# Patient Record
Sex: Female | Born: 1995 | Race: Black or African American | Hispanic: No | Marital: Single | Smoking: Never smoker
Health system: Southern US, Community
[De-identification: ages and names within clinical notes are randomized; demographics above are authoritative.]

---

## 2021-03-05 ENCOUNTER — Inpatient Hospital Stay (HOSPITAL_BASED_OUTPATIENT_CLINIC_OR_DEPARTMENT_OTHER): Payer: Medicaid Other

## 2021-03-05 ENCOUNTER — Inpatient Hospital Stay (HOSPITAL_COMMUNITY)
Admission: AD | Admit: 2021-03-05 | Discharge: 2021-03-05 | Disposition: A | Discharge status: Home / Self Care | Payer: Medicaid Other | Attending: Obstetrics & Gynecology | Admitting: Obstetrics & Gynecology

## 2021-03-05 ENCOUNTER — Encounter (HOSPITAL_COMMUNITY): Payer: Self-pay | Admitting: Obstetrics & Gynecology

## 2021-03-05 DIAGNOSIS — O4442 Low lying placenta NOS or without hemorrhage, second trimester: Secondary | ICD-10-CM | POA: Diagnosis not present

## 2021-03-05 DIAGNOSIS — O4440 Low lying placenta NOS or without hemorrhage, unspecified trimester: Secondary | ICD-10-CM

## 2021-03-05 DIAGNOSIS — O4412 Placenta previa with hemorrhage, second trimester: Secondary | ICD-10-CM | POA: Diagnosis present

## 2021-03-05 DIAGNOSIS — Z3A25 25 weeks gestation of pregnancy: Secondary | ICD-10-CM | POA: Insufficient documentation

## 2021-03-05 NOTE — MAU Provider Note (Signed)
History     [id number]  Arrival date and time: 03/05/21 1536    Chief Complaint  Patient presents with  . Pregnancy Ultrasound     HPI Christine Bruce is a 25 y.o. at [redacted]w[redacted]d by patient report with PMHx notable for placenta previa, who presents for concern regarding placenta previa.   Patient just moved to [place] from [place], [place] to be closer to her mother who also moved here from [place] She was planning to go to [facility] but they informed her they don't take medicaid Currently planning to go to [facility] Reports she has a placenta previa and was told to get a follow up US soon but hasn't been able to since she moved Denies bleeding, abdominal pain, vaginal loss of fluid Endorses good fetal movement     OB History    Gravida  1   Para      Term      Preterm      AB      Living        SAB      IAB      Ectopic      Multiple      Live Births              History reviewed. No pertinent past medical history.  History reviewed. No pertinent surgical history.  No family history on file.  Social History   Socioeconomic History  . Marital status: Single    Spouse name: Not on file  . Number of children: Not on file  . Years of education: Not on file  . Highest education level: Not on file  Occupational History  . Not on file  Tobacco Use  . Smoking status: Never Smoker  . Smokeless tobacco: Never Used  Vaping Use  . Vaping Use: Never used  Substance and Sexual Activity  . Alcohol use: Not Currently  . Drug use: Never  . Sexual activity: Not Currently  Other Topics Concern  . Not on file  Social History Narrative  . Not on file   Social Determinants of Health   Financial Resource Strain: Not on file  Food Insecurity: Not on file  Transportation Needs: Not on file  Physical Activity: Not on file  Stress: Not on file  Social Connections: Not on file  Intimate Partner Violence: Not on file    No Known Allergies  No current  facility-administered medications on file prior to encounter.   Current Outpatient Medications on File Prior to Encounter  Medication Sig Dispense Refill  . calcium-vitamin D (OSCAL WITH D) 500-200 MG-UNIT tablet Take 1 tablet by mouth.    . Prenatal Vit-Fe Fumarate-FA (PRENATAL MULTIVITAMIN) TABS tablet Take 1 tablet by mouth daily at 12 noon.       ROS Pertinent positives and negative per HPI, all others reviewed and negative  Physical Exam   BP 107/77 (BP Location: Right Arm)   Pulse (!) 101   Temp 99 F (37.2 C) (Oral)   Resp 16   Ht 4\' 8"  (1.422 m)   Wt 46.4 kg   SpO2 100%   BMI 22.94 kg/m   Patient Vitals for the past 24 hrs:  BP Temp Temp src Pulse Resp SpO2 Height Weight  03/05/21 1839 -- -- -- (!) 101 16 100 % -- --  03/05/21 1553 107/77 99 F (37.2 C) Oral 88 16 100 % 4\' 8"  (1.422 m) 46.4 kg    Physical Exam Vitals reviewed.  Constitutional:  General: She is not in acute distress.    Appearance: She is well-developed. She is not diaphoretic.  Eyes:     General: No scleral icterus. Pulmonary:     Effort: Pulmonary effort is normal. No respiratory distress.  Abdominal:     General: There is no distension.     Palpations: Abdomen is soft.     Tenderness: There is no abdominal tenderness. There is no guarding or rebound.  Skin:    General: Skin is warm and dry.  Neurological:     Mental Status: She is alert.     Coordination: Coordination normal.      Cervical Exam    Bedside Ultrasound Pt informed that the ultrasound is considered a limited OB ultrasound and is not intended to be a complete ultrasound exam.  Patient also informed that the ultrasound is not being completed with the intent of assessing for fetal or placental anomalies or any pelvic abnormalities.  Explained that the purpose of today's ultrasound is to assess for  placental location.  Patient acknowledges the purpose of the exam and the limitations of the study.    My interpretation:  placenta appears low lying but not certain with bedside US  FHT Baseline 150, moderate variability, +10x10 accels, no decels Toco: quiet Cat: I  Labs No results found for this or any previous visit (from the past 24 hour(s)).  Imaging No results found.  MAU Course  Procedures Lab Orders  No laboratory test(s) ordered today   No orders of the defined types were placed in this encounter.   Imaging Orders     [place] MFM OB Limited  MDM moderate  Assessment and Plan  #Low lying placenta #Transfer of prenatal care Bedside US suggestive of low lying placenta, but given newly transferred to area and no prior records available obtained limited US. This confirms low lying posterior placenta 1.8 cm from os. Discussed with patient she no longer carries diagnosis of placenta previa and reassured her that it will likely continue to migrate further as she advances in her pregnancy. Patient planning to establish care with [facility], ROI completed and will ask registration to fax it to her provider in [place]. Encouraged her to call [facility] practice of her choice to establish care ASAP.   #FWB FHT Cat I NST: Reactive  Discharged to home in stable condition.   Venora Maples, MD/MPH 03/05/21 6:51 PM  Allergies as of 03/05/2021   No Known Allergies     Medication List    TAKE these medications   calcium-vitamin D 500-200 MG-UNIT tablet Commonly known as: OSCAL WITH D Take 1 tablet by mouth.   prenatal multivitamin Tabs tablet Take 1 tablet by mouth daily at 12 noon.

## 2021-03-05 NOTE — Discharge Instructions (Signed)
[facility] Providers    [facility]      Phone: [phone]  [facility]    Phone: [phone]  [facility] [place]   Phone: [phone]  [facility] [place]   Phone: [phone]  [facility] [place]   Phone: [phone]  [facility]    Phone: [phone]  [facility]        Phone: [phone]  [facility]     Phone: [phone]   [facility]     Phone: [phone]  [facility]     Phone: [phone]  [facility]     Phone: [phone]  [facility]        Phone: [phone]   [facility]   Phone: [phone]  [facility]     Phone: [phone]  [facility]    Phone: [phone]  [facility]       Phone: [phone]  [facility]     Phone: [phone]   

## 2021-03-05 NOTE — MAU Note (Signed)
Pt just moved from [facility]. She is [redacted]w[redacted]d. She is G1P0. She states that she  "placenta covering her cervix." She missed the follow-up U/S in [place] to check on this. She denies VB, or abdominal cramping. +FM.

## 2021-03-06 DIAGNOSIS — Z3A25 25 weeks gestation of pregnancy: Secondary | ICD-10-CM

## 2021-03-06 DIAGNOSIS — O4402 Placenta previa specified as without hemorrhage, second trimester: Secondary | ICD-10-CM

## 2021-03-13 ENCOUNTER — Inpatient Hospital Stay (HOSPITAL_COMMUNITY)
Admission: EM | Admit: 2021-03-13 | Discharge: 2021-03-14 | Disposition: A | Discharge status: Home / Self Care | Payer: Medicaid Other | Attending: Obstetrics and Gynecology | Admitting: Obstetrics and Gynecology

## 2021-03-13 ENCOUNTER — Encounter (HOSPITAL_COMMUNITY): Payer: Self-pay | Admitting: Obstetrics and Gynecology

## 2021-03-13 DIAGNOSIS — O4692 Antepartum hemorrhage, unspecified, second trimester: Secondary | ICD-10-CM | POA: Insufficient documentation

## 2021-03-13 DIAGNOSIS — O4690 Antepartum hemorrhage, unspecified, unspecified trimester: Secondary | ICD-10-CM

## 2021-03-13 DIAGNOSIS — B373 Candidiasis of vulva and vagina: Secondary | ICD-10-CM | POA: Insufficient documentation

## 2021-03-13 DIAGNOSIS — O2392 Unspecified genitourinary tract infection in pregnancy, second trimester: Secondary | ICD-10-CM | POA: Insufficient documentation

## 2021-03-13 DIAGNOSIS — O98819 Other maternal infectious and parasitic diseases complicating pregnancy, unspecified trimester: Secondary | ICD-10-CM

## 2021-03-13 DIAGNOSIS — Z3689 Encounter for other specified antenatal screening: Secondary | ICD-10-CM

## 2021-03-13 DIAGNOSIS — Z3A26 26 weeks gestation of pregnancy: Secondary | ICD-10-CM | POA: Insufficient documentation

## 2021-03-13 NOTE — ED Provider Notes (Signed)
MSE was initiated and I personally evaluated the patient and placed orders (if any) at  11:24 PM on March 13, 2021.  Patient to ED with vaginal bleeding. Chart reviewed. Admission 6/2 with placenta previa, then 25w.   Discussed the patient with MAU provider who accepts the patient for transfer.   The patient appears stable so that the remainder of the MSE may be completed by another provider.   Elpidio Anis, PA-C 03/13/21 2325    Gwyneth Sprout, MD 03/15/21 1559

## 2021-03-13 NOTE — ED Notes (Signed)
Pt checked in to the ed [redacted]weeks pregnant   She checked in and the triage area was full  No vitals  Provider was called in mau  Pt  Sent there from the waiting room

## 2021-03-13 NOTE — MAU Note (Signed)
Pt transferred from [facility]. C/O vaginal bleeding. Pt stated she went to the BR tonight and wiped and saw some blood. Denies any pain or cramping. Pt was to;s earlier in the pregnancy she had a previa but had a follow up scan last week and was told it was not anymore.  Good fetal movement felt.

## 2021-03-14 ENCOUNTER — Inpatient Hospital Stay (HOSPITAL_BASED_OUTPATIENT_CLINIC_OR_DEPARTMENT_OTHER): Payer: Medicaid Other

## 2021-03-14 DIAGNOSIS — Z3A26 26 weeks gestation of pregnancy: Secondary | ICD-10-CM

## 2021-03-14 DIAGNOSIS — O4692 Antepartum hemorrhage, unspecified, second trimester: Secondary | ICD-10-CM | POA: Diagnosis not present

## 2021-03-14 DIAGNOSIS — O468X2 Other antepartum hemorrhage, second trimester: Secondary | ICD-10-CM

## 2021-03-14 DIAGNOSIS — B373 Candidiasis of vulva and vagina: Secondary | ICD-10-CM

## 2021-03-14 DIAGNOSIS — O23592 Infection of other part of genital tract in pregnancy, second trimester: Secondary | ICD-10-CM

## 2021-03-14 DIAGNOSIS — O2392 Unspecified genitourinary tract infection in pregnancy, second trimester: Secondary | ICD-10-CM | POA: Diagnosis not present

## 2021-03-14 LAB — CBC
HCT: 28.5 % — ABNORMAL LOW (ref 36.0–46.0)
Hemoglobin: 9.4 g/dL — ABNORMAL LOW (ref 12.0–15.0)
MCH: 27.9 pg (ref 26.0–34.0)
MCHC: 33 g/dL (ref 30.0–36.0)
MCV: 84.6 fL (ref 80.0–100.0)
Platelets: 261 10*3/uL (ref 150–400)
RBC: 3.37 MIL/uL — ABNORMAL LOW (ref 3.87–5.11)
RDW: 12.8 % (ref 11.5–15.5)
WBC: 7.7 10*3/uL (ref 4.0–10.5)
nRBC: 0 % (ref 0.0–0.2)

## 2021-03-14 LAB — WET PREP, GENITAL
Clue Cells Wet Prep HPF POC: NONE SEEN
Sperm: NONE SEEN
Trich, Wet Prep: NONE SEEN

## 2021-03-14 MED ORDER — TERCONAZOLE 0.4 % VA CREA: 1.0000 | TOPICAL_CREAM | Freq: Every day | VAGINAL | 0 refills | Status: AC

## 2021-03-14 NOTE — MAU Provider Note (Signed)
History     CSN: [id number]  Arrival date and time: 03/13/21 2247   Event Date/Time   First Provider Initiated Contact with Patient 03/14/21 0013      Chief Complaint  Patient presents with   Vaginal Bleeding   Christine Bruce is a 25 y.o. G1P0 at [redacted]w[redacted]d who recently relocated from [place], [place].  She presents today for Vaginal Bleeding.  She states she noted bleeding with wiping that was "pinkish red."  She states it was just on the tissue and continued with wiping then "eventually went away."  She states she noted a little blood on arrival.  She denies abdominal cramping, but reports she had some earlier that lasted about 15 seconds.  She endorses fetal movement and denies discharge prior to the bleeding.    OB History     Gravida  1   Para      Term      Preterm      AB      Living         SAB      IAB      Ectopic      Multiple      Live Births              History reviewed. No pertinent past medical history.  History reviewed. No pertinent surgical history.  No family history on file.  Social History   Tobacco Use   Smoking status: Never   Smokeless tobacco: Never  Vaping Use   Vaping Use: Never used  Substance Use Topics   Alcohol use: Not Currently   Drug use: Never    Allergies: No Known Allergies  Medications Prior to Admission  Medication Sig Dispense Refill Last Dose   calcium-vitamin D (OSCAL WITH D) 500-200 MG-UNIT tablet Take 1 tablet by mouth.   03/13/2021   Prenatal Vit-Fe Fumarate-FA (PRENATAL MULTIVITAMIN) TABS tablet Take 1 tablet by mouth daily at 12 noon.   03/13/2021    Review of Systems  Constitutional:  Negative for chills and fever.  Gastrointestinal:  Negative for abdominal pain, constipation, diarrhea, nausea and vomiting.  Genitourinary:  Positive for vaginal bleeding. Negative for difficulty urinating, dysuria and vaginal discharge.  Neurological:  Negative for dizziness, light-headedness and headaches.  Physical  Exam   Blood pressure 114/79, pulse 96, temperature 99 F (37.2 C), resp. rate 18.  Physical Exam Vitals reviewed. Exam conducted with a chaperone present.  Constitutional:      Appearance: Normal appearance.  HENT:     Head: Normocephalic and atraumatic.  Eyes:     Conjunctiva/sclera: Conjunctivae normal.  Cardiovascular:     Rate and Rhythm: Normal rate and regular rhythm.     Heart sounds: Normal heart sounds.  Pulmonary:     Effort: Pulmonary effort is normal. No respiratory distress.     Breath sounds: Normal breath sounds.  Genitourinary:    Comments: Sterile Speculum Exam: -Normal External Genitalia: Non tender, no apparent discharge at introitus. Labia appears swollen. No erythema or excoriations noted.  -Vaginal Vault: Pink mucosa with good rugae. Copious amt thick greenish white discharge -wet prep collected -Cervix:Pink, no lesions, cysts, or polyps.  Appears closed. No active bleeding, but moderate amt thick curdy discharge noted from os-GC/CT collected -Bimanual Exam:  Deferred   Musculoskeletal:        General: Normal range of motion.     Cervical back: Normal range of motion.     Right lower leg: No edema.  Left lower leg: No edema.  Skin:    General: Skin is warm and dry.  Neurological:     Mental Status: She is alert and oriented to person, place, and time.  Psychiatric:        Mood and Affect: Mood normal.        Behavior: Behavior normal.        Thought Content: Thought content normal.    Fetal Assessment 145 bpm, Mod Var, -Decels, +Accels Toco: No ctx graphed  MAU Course   Results for orders placed or performed during the hospital encounter of 03/13/21 (from the past 24 hour(s))  CBC     Status: Abnormal   Collection Time: 03/14/21 12:24 AM  Result Value Ref Range   WBC 7.7 4.0 - 10.5 K/uL   RBC 3.37 (L) 3.87 - 5.11 MIL/uL   Hemoglobin 9.4 (L) 12.0 - 15.0 g/dL   HCT 25.6 (L) 38.9 - 37.3 %   MCV 84.6 80.0 - 100.0 fL   MCH 27.9 26.0 - 34.0  pg   MCHC 33.0 30.0 - 36.0 g/dL   RDW 42.8 76.8 - 11.5 %   Platelets 261 150 - 400 K/uL   nRBC 0.0 0.0 - 0.2 %  ABO/Rh     Status: None   Collection Time: 03/14/21 12:24 AM  Result Value Ref Range   ABO/RH(D)      B POS Performed at [facility], [address], [place], [place] [address]   Wet prep, genital     Status: Abnormal   Collection Time: 03/14/21  1:13 AM   Specimen: Vaginal  Result Value Ref Range   Yeast Wet Prep HPF POC PRESENT (A) NONE SEEN   Trich, Wet Prep NONE SEEN NONE SEEN   Clue Cells Wet Prep HPF POC NONE SEEN NONE SEEN   WBC, Wet Prep HPF POC MANY (A) NONE SEEN   Sperm NONE SEEN        MDM PE with Wet Prep, GC/CT Labs: CBC, ABO EFM MFM [place] Assessment and Plan  26 year old G1P0  SIUP at 26.6 weeks Cat I FT Candidiasis of Vagina Vaginal Bleeding  -POC Reviewed -Labs ordered and collected -Exam performed and findings discussed. -Informed that exam is positive for yeast infection. -Discussed treatment. -Reassured that no active bleeding noted from cervix or blood in vault.  -Cultures collected and pending.  -Will send for [place] and await results.  Cherre Robins MSN, CNM 03/14/2021, 12:13 AM   Reassessment (1:27 AM)  -Preliminary [place] returns with small [facility] noted. -Patient informed of findings. No questions or concerns. -Instructed to maintain pelvic rest until ob appt.  -Message sent to [facility] for scheduling of NOB appt. -Bleeding precautions given. -Rx for Terazol 7 sent to pharmacy on file.  -Encouraged to call or return to MAU if symptoms worsen or with the onset of new symptoms. -Discharged to home in stable condition.  Cherre Robins MSN, CNM Advanced Practice Provider, [facility] [employer]

## 2021-03-16 LAB — GC/CHLAMYDIA PROBE AMP (~~LOC~~) NOT AT ARMC
Chlamydia: NEGATIVE
Comment: NEGATIVE
Comment: NORMAL
Neisseria Gonorrhea: NEGATIVE

## 2021-03-16 LAB — ABO/RH: ABO/RH(D): B POS

## 2021-04-13 ENCOUNTER — Ambulatory Visit (INDEPENDENT_AMBULATORY_CARE_PROVIDER_SITE_OTHER): Payer: Medicaid Other | Admitting: Obstetrics and Gynecology

## 2021-04-13 ENCOUNTER — Encounter: Payer: Self-pay | Admitting: Obstetrics and Gynecology

## 2021-04-13 ENCOUNTER — Other Ambulatory Visit: Payer: Self-pay

## 2021-04-13 VITALS — BP 114/79 | HR 86 | Wt 105.5 lb

## 2021-04-13 DIAGNOSIS — O99013 Anemia complicating pregnancy, third trimester: Secondary | ICD-10-CM

## 2021-04-13 DIAGNOSIS — B373 Candidiasis of vulva and vagina: Secondary | ICD-10-CM

## 2021-04-13 DIAGNOSIS — O4443 Low lying placenta NOS or without hemorrhage, third trimester: Secondary | ICD-10-CM | POA: Diagnosis not present

## 2021-04-13 DIAGNOSIS — O0990 Supervision of high risk pregnancy, unspecified, unspecified trimester: Secondary | ICD-10-CM

## 2021-04-13 DIAGNOSIS — O4440 Low lying placenta NOS or without hemorrhage, unspecified trimester: Secondary | ICD-10-CM | POA: Diagnosis not present

## 2021-04-13 DIAGNOSIS — O26842 Uterine size-date discrepancy, second trimester: Secondary | ICD-10-CM

## 2021-04-13 DIAGNOSIS — O0933 Supervision of pregnancy with insufficient antenatal care, third trimester: Secondary | ICD-10-CM

## 2021-04-13 DIAGNOSIS — B3731 Acute candidiasis of vulva and vagina: Secondary | ICD-10-CM

## 2021-04-13 MED ORDER — BLOOD PRESSURE KIT DEVI: 1.0000 | 0 refills | Status: AC | PRN

## 2021-04-13 MED ORDER — BLOOD PRESSURE KIT DEVI: 1.0000 | 0 refills | Status: DC | PRN

## 2021-04-13 NOTE — Patient Instructions (Signed)
Do not have sexual intercourse or place anything in the vagina until your ultrasound is normal.

## 2021-04-13 NOTE — Progress Notes (Signed)
New OB Note  04/13/2021   Clinic: [facility]  Chief Complaint: Transfer of care from Christine Bruce, [place] ([employer])  History of Present Illness: Christine Bruce is a 25 y.o. G1@ 31/1 weeks (EDC 9/11, per patient). Patient states she had an early u/s at around 7-8 weeks that gave her her EDC.   Preg complicated by has Low-lying placenta; Supervision of high risk pregnancy, antepartum; Insufficient prenatal care in third trimester; Vulvovaginal candidiasis; and Fundal height low for dates in second trimester on their problem list.   Patient states no issues with pregnancy except for low lying placenta. Last OB visit with prior OB is early April.   She was seen in MAU twice for VB with last episodes June 11 and dx with a yeast infection and u/s showed small subchorionic hemorrhage; unclear if placenta had moved away. Patient denies any VB since then and good FM.   ROS: A 12-point review of systems was performed and negative, except as stated in the above HPI.  OBGYN History: As per HPI. OB History  Gravida Para Term Preterm AB Living  1            SAB IAB Ectopic Multiple Live Births               # Outcome Date GA Lbr Len/2nd Weight Sex Delivery Anes PTL Lv  1 Current            Pap smear history: patient states normal with prior OB   Past Medical History: History reviewed. No pertinent past medical history.  Past Surgical History: History reviewed. No pertinent surgical history.  Family History:  History reviewed. No pertinent family history.  Social History:  Social History   Socioeconomic History   Marital status: Single    Spouse name: Not on file   Number of children: Not on file   Years of education: Not on file   Highest education level: Not on file  Occupational History   Not on file  Tobacco Use   Smoking status: Never   Smokeless tobacco: Never  Vaping Use   Vaping Use: Never used  Substance and Sexual  Activity   Alcohol use: Not Currently   Drug use: Never   Sexual activity: Not Currently  Other Topics Concern   Not on file  Social History Narrative   Not on file   Social Determinants of Health   Financial Resource Strain: Not on file  Food Insecurity: No Food Insecurity   Worried About Running Out of Food in the Last Year: Never true   Ran Out of Food in the Last Year: Never true  Transportation Needs: No Transportation Needs   Lack of Transportation (Medical): No   Lack of Transportation (Non-Medical): No  Physical Activity: Not on file  Stress: Not on file  Social Connections: Not on file  Intimate Partner Violence: Not on file    Allergy: No Known Allergies    Current Outpatient Medications: vitamin  Physical Exam:   BP 114/79   Pulse 86   Wt 105 lb 8 oz (47.9 kg)   BMI 23.65 kg/m  Body mass index is 23.65 kg/m. Contractions: Not present Vag. Bleeding: None. Fundal height: 28 FHTs: 150s  General appearance: Well nourished, well developed female in no acute distress.  Cardiovascular: S1, S2 normal, no murmur, rub or gallop, regular rate and rhythm Respiratory:  Clear to auscultation bilateral. Normal respiratory effort Abdomen: gravid, nttp Neuro/Psych:  Normal mood  and affect.  Skin:  Warm and dry.  Pelvic exam: patient declines  Laboratory: CBC Latest Ref Rng & Units 03/14/2021  WBC 4.0 - 10.5 K/uL 7.7  Hemoglobin 12.0 - 15.0 g/dL 1.6(X9.4(L)  Hematocrit 09.636.0 - 46.0 % 28.5(L)  Platelets 150 - 400 K/uL 261   Negative GC/CT.   Positive wet prep for yeast  Imaging:  Narrative & Impression  ----------------------------------------------------------------------  OBSTETRICS REPORT                        (Signed Final 03/14/2021 10:30 pm) ---------------------------------------------------------------------- Patient Info  ID #:       [id number]                          D.O.B.:  1996-07-31 (24 yrs)  Name:       Christine Bruce                       Visit Date:  03/14/2021 12:26 am ---------------------------------------------------------------------- Performed By  Attending:        Lin Landsmanorenthian Booker      Referred By:       [facility]                    MD  Performed By:     Percell BostonHeather Waken          [facility] ---------------------------------------------------------------------- Orders  #  Description                           Code        Ordered By  1  US MFM OB LIMITED                     [id number]    JESSICA EMLY ----------------------------------------------------------------------  #  Order #                     Accession #                Episode #  1  [id number]                   [id number]                 [id number] ---------------------------------------------------------------------- Indications  [redacted] weeks gestation of pregnancy                 Z3A.26  Vaginal bleeding in pregnancy, second           O46.92  trimester  Encounter for antenatal screening,              Z36.9  unspecified ---------------------------------------------------------------------- Fetal Evaluation  Num Of Fetuses:          1  Fetal Heart Rate(bpm):   167  Cardiac Activity:        Observed  Presentation:            Cephalic  Placenta:  Posterior  P. Cord Insertion:       Not well visualized  Amniotic Fluid  AFI FV:      Within normal limits                              Largest Pocket(cm)                              4.5  Comment:    Small subchorionic hemorrhage noted. ---------------------------------------------------------------------- Biometry  CER:      31.5  mm     G. Age:  27w 1d         76  % ---------------------------------------------------------------------- OB History  Gravidity:    1 ---------------------------------------------------------------------- Gestational Age  Clinical EDD:  26w 6d                                         EDD:   06/14/21  Best:          26w 6d     Det. By:  Clinical EDD             EDD:   06/14/21 ---------------------------------------------------------------------- Anatomy  Cranium:               Appears normal         RVOT:                   Appears normal  Cavum:                 Appears normal         LVOT:                   Appears normal  Ventricles:            Appears normal         Diaphragm:              Appears normal  Cerebellum:            Appears normal         Stomach:                Appears normal, left                                                                        sided  Posterior Fossa:       Appears normal         Abdominal Wall:         Appears nml (cord                                                                        insert, abd wall)  Thoracic:  Appears normal         Kidneys:                Appear normal  Heart:                 Appears normal         Bladder:                Appears normal                         (4CH, axis, and                         situs) ---------------------------------------------------------------------- Cervix Uterus Adnexa  Cervix  Length:            3.1  cm.  Not visualized (advanced GA >24wks) Closed  Uterus  No abnormality visualized.  Right Ovary  No adnexal mass visualized.  Left Ovary  No adnexal mass visualized.  Cul De Sac  No free fluid seen.  Adnexa  No abnormality visualized. ---------------------------------------------------------------------- Impression  Limied exam to asses vaginal bleeding  There is a small posteior subchorionc hemorrahge.  Anatomy is suboptimally viewed secondary to fetal position.  Good fetal movement and amniotic fluid volume ---------------------------------------------------------------------- Recommendations  Clincal correlation recommended. ----------------------------------------------------------------------               Lin Landsman, MD Electronically  Signed Final Report   03/14/2021 10:30 pm    Narrative & Impression  ----------------------------------------------------------------------  OBSTETRICS REPORT                       (Signed Final 03/06/2021 08:19 am) ---------------------------------------------------------------------- Patient Info  ID #:       [id number]                          D.O.B.:  02-16-1996 (24 yrs)  Name:       Christine Bruce                       Visit Date: 03/05/2021 04:46 pm ---------------------------------------------------------------------- Performed By  Attending:        Noralee Space MD        Referred By:      [facility] MAU/Triage  Performed By:     Hurman Horn          [facility] ---------------------------------------------------------------------- Orders  #  Description                           Code        Ordered By  1  [place] MFM OB LIMITED                     [id number]    MATTHEW ECKSTAT ----------------------------------------------------------------------  #  Order #                     Accession #  Episode #  1  [id number]                   [id number]                 [id number] ---------------------------------------------------------------------- Indications  [redacted] weeks gestation of pregnancy                Z3A.25  Placenta previa specified as without           O44.02  hemorrhage, second trimester ---------------------------------------------------------------------- Fetal Evaluation  Num Of Fetuses:         1  Fetal Heart Rate(bpm):  160  Cardiac Activity:       Observed  Presentation:           Cephalic  Placenta:               Posterior, low-lying, 1.8 cm from int os  P. Cord Insertion:      Visualized, central  Amniotic Fluid  AFI FV:      Within normal limits                              Largest Pocket(cm)                               5.8 ---------------------------------------------------------------------- OB History  Gravidity:    1 ---------------------------------------------------------------------- Gestational Age  Clinical EDD:  25w 4d                                        EDD:   06/14/21  Best:          25w 4d     Det. By:  Clinical EDD             EDD:   06/14/21 ---------------------------------------------------------------------- Cervix Uterus Adnexa  Cervix  Length:           3.33  cm.  Normal appearance by transabdominal scan.  Uterus  No abnormality visualized.  Right Ovary  Not visualized.  Left Ovary  Not visualized.  Adnexa  No abnormality visualized. ---------------------------------------------------------------------- Impression  Patient had concerns about placenta previa that was  diagnosed in [place], [place].  She recently moved to  [facility] and will be delivering here.  She gives no history  of vaginal bleeding.  A limited ultrasound study was performed.  Amniotic fluid is  normal good fetal activity seen.  Placenta is posterior and the  placental edge is 1.8 cm from the internal os (low-lying  placenta). ---------------------------------------------------------------------- Recommendations  -Fetal anatomical survey and transvaginal assessment of  placenta in 2 to 3 weeks. ----------------------------------------------------------------------                  Noralee Space, MD Electronically Signed Final Report   03/06/2021 08:19 am    Assessment: pt stable  Plan: 1. Low-lying placenta Looking at the images from 6/11, it looks likes the placenta is still low lying. Pelvic rest advised until follow up ultrasound shows normal placenta - [place] MFM OB DETAIL +14 WK; Future - [place] MFM OB Transvaginal; Future  2. Insufficient prenatal care in third trimester - [place] MFM OB DETAIL +14 WK; Future - Glucose tolerance, 1 hour  3. Supervision of high risk pregnancy, antepartum No  records in media and nothing in care  everywhere. I told her I recommend repeating her labs and pap smear but patient does not want to do pelvic exam and pap smear. I also recommended to do a pelvic exam to see if her yeast infection has cleared up since she's had an ob triage visit with [facility] and with [place] for this reason. Pt still declines. Will do 1h GTT today  Stat f/u u/s and transvag u/s ordered with mfm.   Another ROI signed to try and get records and patient asked to also try and get records  4. Vulvovaginal candidiasis See above  5. Fundal height low for dates in second trimester Borderline low. F/u US  6. Anemia F/u labs for today  Problem list reviewed and updated.  Follow up in 1-2 weeks.  >50% of 30 min visit spent on counseling and coordination of care.     Cornelia Copa MD Attending Center for [facility])

## 2021-04-14 ENCOUNTER — Ambulatory Visit: Payer: Medicaid Other | Attending: Obstetrics and Gynecology

## 2021-04-14 ENCOUNTER — Ambulatory Visit (HOSPITAL_BASED_OUTPATIENT_CLINIC_OR_DEPARTMENT_OTHER): Payer: Medicaid Other | Admitting: Maternal & Fetal Medicine

## 2021-04-14 ENCOUNTER — Ambulatory Visit: Payer: Medicaid Other | Admitting: *Deleted

## 2021-04-14 ENCOUNTER — Encounter: Payer: Self-pay | Admitting: *Deleted

## 2021-04-14 ENCOUNTER — Other Ambulatory Visit: Payer: Self-pay | Admitting: Obstetrics and Gynecology

## 2021-04-14 VITALS — BP 112/72 | HR 97

## 2021-04-14 DIAGNOSIS — O4440 Low lying placenta NOS or without hemorrhage, unspecified trimester: Secondary | ICD-10-CM | POA: Diagnosis present

## 2021-04-14 DIAGNOSIS — O0993 Supervision of high risk pregnancy, unspecified, third trimester: Secondary | ICD-10-CM | POA: Diagnosis not present

## 2021-04-14 DIAGNOSIS — O0933 Supervision of pregnancy with insufficient antenatal care, third trimester: Secondary | ICD-10-CM

## 2021-04-14 DIAGNOSIS — Z3A31 31 weeks gestation of pregnancy: Secondary | ICD-10-CM

## 2021-04-14 DIAGNOSIS — O4453 Low lying placenta with hemorrhage, third trimester: Secondary | ICD-10-CM

## 2021-04-14 DIAGNOSIS — O4443 Low lying placenta NOS or without hemorrhage, third trimester: Secondary | ICD-10-CM

## 2021-04-14 DIAGNOSIS — Z363 Encounter for antenatal screening for malformations: Secondary | ICD-10-CM | POA: Diagnosis not present

## 2021-04-14 DIAGNOSIS — O0990 Supervision of high risk pregnancy, unspecified, unspecified trimester: Secondary | ICD-10-CM

## 2021-04-14 DIAGNOSIS — Z364 Encounter for antenatal screening for fetal growth retardation: Secondary | ICD-10-CM

## 2021-04-14 DIAGNOSIS — O365930 Maternal care for other known or suspected poor fetal growth, third trimester, not applicable or unspecified: Secondary | ICD-10-CM

## 2021-04-14 LAB — GLUCOSE TOLERANCE, 1 HOUR: Glucose, 1Hr PP: 114 mg/dL (ref 65–199)

## 2021-04-14 LAB — CBC/D/PLT+RPR+RH+ABO+RUBIGG...
Antibody Screen: NEGATIVE
Basophils Absolute: 0 10*3/uL (ref 0.0–0.2)
Basos: 0 %
EOS (ABSOLUTE): 0.2 10*3/uL (ref 0.0–0.4)
Eos: 2 %
HCV Ab: 0.1 s/co ratio (ref 0.0–0.9)
HIV Screen 4th Generation wRfx: NONREACTIVE
Hematocrit: 29 % — ABNORMAL LOW (ref 34.0–46.6)
Hemoglobin: 9.3 g/dL — ABNORMAL LOW (ref 11.1–15.9)
Hepatitis B Surface Ag: NEGATIVE
Immature Grans (Abs): 0.1 10*3/uL (ref 0.0–0.1)
Immature Granulocytes: 1 %
Lymphocytes Absolute: 1.7 10*3/uL (ref 0.7–3.1)
Lymphs: 20 %
MCH: 26.3 pg — ABNORMAL LOW (ref 26.6–33.0)
MCHC: 32.1 g/dL (ref 31.5–35.7)
MCV: 82 fL (ref 79–97)
Monocytes Absolute: 0.6 10*3/uL (ref 0.1–0.9)
Monocytes: 8 %
Neutrophils Absolute: 5.9 10*3/uL (ref 1.4–7.0)
Neutrophils: 69 %
Platelets: 277 10*3/uL (ref 150–450)
RBC: 3.53 x10E6/uL — ABNORMAL LOW (ref 3.77–5.28)
RDW: 12.7 % (ref 11.7–15.4)
RPR Ser Ql: NONREACTIVE
Rh Factor: POSITIVE
Rubella Antibodies, IGG: 1.38 index (ref >0.99)
WBC: 8.5 10*3/uL (ref 3.4–10.8)

## 2021-04-14 LAB — COMPREHENSIVE METABOLIC PANEL
ALT: 9 IU/L (ref 0–32)
AST: 15 IU/L (ref 0–40)
Albumin/Globulin Ratio: 1.3 (ref 1.2–2.2)
Albumin: 3.8 g/dL — ABNORMAL LOW (ref 3.9–5.0)
Alkaline Phosphatase: 152 IU/L — ABNORMAL HIGH (ref 44–121)
BUN/Creatinine Ratio: 26 — ABNORMAL HIGH (ref 9–23)
BUN: 17 mg/dL (ref 6–20)
Bilirubin Total: 0.2 mg/dL (ref 0.0–1.2)
CO2: 17 mmol/L — ABNORMAL LOW (ref 20–29)
Calcium: 9.5 mg/dL (ref 8.7–10.2)
Chloride: 105 mmol/L (ref 96–106)
Creatinine, Ser: 0.65 mg/dL (ref 0.57–1.00)
Globulin, Total: 3 g/dL (ref 1.5–4.5)
Glucose: 117 mg/dL — ABNORMAL HIGH (ref 65–99)
Potassium: 4.2 mmol/L (ref 3.5–5.2)
Sodium: 141 mmol/L (ref 134–144)
Total Protein: 6.8 g/dL (ref 6.0–8.5)
eGFR: 128 mL/min/{1.73_m2} (ref >59)

## 2021-04-14 LAB — PROTEIN / CREATININE RATIO, URINE
Creatinine, Urine: 166.5 mg/dL
Protein, Ur: 33 mg/dL
Protein/Creat Ratio: 198 mg/g creat (ref 0–200)

## 2021-04-14 LAB — HCV INTERPRETATION

## 2021-04-14 LAB — TSH: TSH: 3.03 u[IU]/mL (ref 0.450–4.500)

## 2021-04-14 NOTE — Progress Notes (Signed)
MFM Brief Note  Ms. Alonzo is a G1P0 here at 21 w 2 d with a single intrauterine pregnancy here for a detailed anatomy  Normal anatomy with measurements consistent with fetal growth restriction (EFW of 7% and AC at the 13%). Biophysical profile was 8/8 with good fetal movement and amniotic fluid volume Suboptimal views of the fetal anatomy were obtained secondary to fetal position. The UA Dopplers are normal without evidence of AEDF or REDF.  I discussed today's visit with a diagnosis of FGR. I explained that the etiology includes placental insufficiency, chronic disease, infection, aneuploidy and other genetic syndromes. She has no genetic testing. She has no additional risk factors for chronic disease. At this time I explained the diagnosis, evaluation and management to include on going fetal growth and weekly antenatal testing to include UA Dopplers. If the EFW < 3rd% or abnormal testing, I recommend delivery at 37 weeks otherwise if all is normal consider delivery at 39 weeks.   I also conveyed to Ms. Cartelli that her placenta is low lying at 9 mm from the internal os. I discussed the increased risk for bleeding now and at the time of delivery if the placental edge remains constant. At this time I recommend we reassess the placental location in 4 weeks when we repeat growth.  We have scheduled Ms. Mustapha to return in 1 week for UA Dopplers and testing.   We will reassess fetal growth and placental location in 4 weeks, at which time we will discuss delivery recommendations.   I spent 30 minutes with > 50% in face to face consultation.

## 2021-04-15 ENCOUNTER — Telehealth: Payer: Self-pay

## 2021-04-15 ENCOUNTER — Other Ambulatory Visit: Payer: Self-pay | Admitting: *Deleted

## 2021-04-15 ENCOUNTER — Encounter: Payer: Self-pay | Admitting: Obstetrics and Gynecology

## 2021-04-15 DIAGNOSIS — O365990 Maternal care for other known or suspected poor fetal growth, unspecified trimester, not applicable or unspecified: Secondary | ICD-10-CM | POA: Insufficient documentation

## 2021-04-15 DIAGNOSIS — O365930 Maternal care for other known or suspected poor fetal growth, third trimester, not applicable or unspecified: Secondary | ICD-10-CM

## 2021-04-15 LAB — ANEMIA PROFILE B
Basophils Absolute: 0 10*3/uL (ref 0.0–0.2)
Basos: 1 %
EOS (ABSOLUTE): 0.2 10*3/uL (ref 0.0–0.4)
Eos: 2 %
Ferritin: 11 ng/mL — ABNORMAL LOW (ref 15–150)
Folate: 9.6 ng/mL (ref >3.0)
Hematocrit: 29.7 % — ABNORMAL LOW (ref 34.0–46.6)
Hemoglobin: 9.3 g/dL — ABNORMAL LOW (ref 11.1–15.9)
Immature Grans (Abs): 0.1 10*3/uL (ref 0.0–0.1)
Immature Granulocytes: 1 %
Iron Saturation: 5 % — CL (ref 15–55)
Iron: 29 ug/dL (ref 27–159)
Lymphocytes Absolute: 1.7 10*3/uL (ref 0.7–3.1)
Lymphs: 20 %
MCH: 26.4 pg — ABNORMAL LOW (ref 26.6–33.0)
MCHC: 31.3 g/dL — ABNORMAL LOW (ref 31.5–35.7)
MCV: 84 fL (ref 79–97)
Monocytes Absolute: 0.5 10*3/uL (ref 0.1–0.9)
Monocytes: 6 %
Neutrophils Absolute: 6 10*3/uL (ref 1.4–7.0)
Neutrophils: 70 %
Platelets: 295 10*3/uL (ref 150–450)
RBC: 3.52 x10E6/uL — ABNORMAL LOW (ref 3.77–5.28)
RDW: 13.1 % (ref 11.7–15.4)
Retic Ct Pct: 1.7 % (ref 0.6–2.6)
Total Iron Binding Capacity: 627 ug/dL (ref 250–450)
UIBC: 598 ug/dL — ABNORMAL HIGH (ref 131–425)
Vitamin B-12: 477 pg/mL (ref 232–1245)
WBC: 8.5 10*3/uL (ref 3.4–10.8)

## 2021-04-15 LAB — SPECIMEN STATUS REPORT

## 2021-04-15 LAB — CULTURE, OB URINE

## 2021-04-15 LAB — URINE CULTURE, OB REFLEX

## 2021-04-15 NOTE — Addendum Note (Signed)
Addended by: [place] Bing on: 04/15/2021 09:49 AM   Modules accepted: Orders

## 2021-04-15 NOTE — Telephone Encounter (Addendum)
-----   Message from Garland Bing, MD sent at 04/15/2021  9:48 AM EDT ----- Please let her know that she needs IV iron. Orders are in. Venofer x 2 doses at least a week apart. Thanks  Venofer scheduled for July 27th @ 0900.  Pt notified of appt and the importance of her getting her levels up prior to delivery.  Pt's concerns all addressed.  Pt verbalized understanding with no further questions.   Christine Bruce 04/15/21

## 2021-04-22 ENCOUNTER — Ambulatory Visit (INDEPENDENT_AMBULATORY_CARE_PROVIDER_SITE_OTHER): Payer: Medicaid Other | Admitting: Obstetrics and Gynecology

## 2021-04-22 ENCOUNTER — Other Ambulatory Visit: Payer: Self-pay

## 2021-04-22 VITALS — BP 115/80 | HR 106 | Wt 107.0 lb

## 2021-04-22 DIAGNOSIS — Z3A32 32 weeks gestation of pregnancy: Secondary | ICD-10-CM | POA: Insufficient documentation

## 2021-04-22 DIAGNOSIS — O26842 Uterine size-date discrepancy, second trimester: Secondary | ICD-10-CM

## 2021-04-22 DIAGNOSIS — O4440 Low lying placenta NOS or without hemorrhage, unspecified trimester: Secondary | ICD-10-CM

## 2021-04-22 DIAGNOSIS — O0990 Supervision of high risk pregnancy, unspecified, unspecified trimester: Secondary | ICD-10-CM

## 2021-04-22 DIAGNOSIS — O365930 Maternal care for other known or suspected poor fetal growth, third trimester, not applicable or unspecified: Secondary | ICD-10-CM

## 2021-04-22 NOTE — Progress Notes (Signed)
   PRENATAL VISIT NOTE  Subjective:  Christine Bruce is a 25 y.o. G1P0 at [redacted]w[redacted]d being seen today for ongoing prenatal care.  She is currently monitored for the following issues for this high-risk pregnancy and has Low-lying placenta; Supervision of high risk pregnancy, antepartum; Insufficient prenatal care in third trimester; Vulvovaginal candidiasis; Fundal height low for dates in second trimester; Anemia during pregnancy in third trimester; IUGR (intrauterine growth restriction) affecting care of mother; and [redacted] weeks gestation of pregnancy on their problem list.  Patient doing well with no acute concerns today. She reports  occasional  Braxton-Hicks ctx .  Contractions: Irritability. Vag. Bleeding: None.  Movement: Present. Denies leaking of fluid.   The following portions of the patient's history were reviewed and updated as appropriate: allergies, current medications, past family history, past medical history, past social history, past surgical history and problem list. Problem list updated.  Objective:   Vitals:   04/22/21 1543  BP: 115/80  Pulse: (!) 106  Weight: 107 lb (48.5 kg)    Fetal Status: Fetal Heart Rate (bpm): 155 Fundal Height: 30 cm Movement: Present     General:  Alert, oriented and cooperative. Patient is in no acute distress.  Skin: Skin is warm and dry. No rash noted.   Cardiovascular: Normal heart rate noted  Respiratory: Normal respiratory effort, no problems with respiration noted  Abdomen: Soft, gravid, appropriate for gestational age.  Pain/Pressure: Present     Pelvic: Cervical exam deferred        Extremities: Normal range of motion.  Edema: None  Mental Status:  Normal mood and affect. Normal behavior. Normal judgment and thought content.   Assessment and Plan:  Pregnancy: G1P0 at [redacted]w[redacted]d  1. [redacted] weeks gestation of pregnancy   2. Supervision of high risk pregnancy, antepartum Continue routine care, weekly fetal testing/dopplers  3. Poor fetal growth  affecting management of mother in third trimester, single or unspecified fetus  EFW 7%, repeat growth in 3-4 weeks  4. Low-lying placenta Placenta 9 mm from cervix, has repeat scan in 3 weeks  5. Fundal height low for dates in second trimester Fundal height 30 cm  Preterm labor symptoms and general obstetric precautions including but not limited to vaginal bleeding, contractions, leaking of fluid and fetal movement were reviewed in detail with the patient.  Please refer to After Visit Summary for other counseling recommendations.   Return in about 2 weeks (around 05/06/2021) for [facility], in person.   Mariel Aloe, MD Faculty Attending Center for [facility]

## 2021-04-23 ENCOUNTER — Ambulatory Visit: Payer: Medicaid Other | Admitting: *Deleted

## 2021-04-23 ENCOUNTER — Other Ambulatory Visit: Payer: Self-pay | Admitting: Maternal & Fetal Medicine

## 2021-04-23 ENCOUNTER — Ambulatory Visit: Payer: Medicaid Other | Attending: Maternal & Fetal Medicine

## 2021-04-23 VITALS — BP 104/67 | HR 100

## 2021-04-23 DIAGNOSIS — O365930 Maternal care for other known or suspected poor fetal growth, third trimester, not applicable or unspecified: Secondary | ICD-10-CM

## 2021-04-23 DIAGNOSIS — O365931 Maternal care for other known or suspected poor fetal growth, third trimester, fetus 1: Secondary | ICD-10-CM | POA: Insufficient documentation

## 2021-04-23 NOTE — Procedures (Signed)
Christine Bruce Aug 07, 1996 [redacted]w[redacted]d  Fetus A Non-Stress Test Interpretation for 04/23/21  Indication: IUGR and Unsatisfactory BPP  Fetal Heart Rate A Mode: External Baseline Rate (A): 145 bpm Variability: Moderate Accelerations: 15 x 15 Decelerations: None Multiple birth?: No  Uterine Activity Mode: Palpation, Toco Contraction Frequency (min): None Resting Tone Palpated: Relaxed Resting Time: Adequate  Interpretation (Fetal Testing) Nonstress Test Interpretation: Reactive Comments: Dr. Parke Poisson reviewed tracing.

## 2021-04-29 ENCOUNTER — Ambulatory Visit: Payer: Medicaid Other

## 2021-04-29 ENCOUNTER — Other Ambulatory Visit: Payer: Self-pay

## 2021-04-29 ENCOUNTER — Ambulatory Visit
Admission: RE | Admit: 2021-04-29 | Discharge: 2021-04-29 | Disposition: A | Discharge status: Home / Self Care | Payer: Medicaid Other | Source: Ambulatory Visit | Attending: Obstetrics and Gynecology | Admitting: Obstetrics and Gynecology

## 2021-04-29 DIAGNOSIS — D649 Anemia, unspecified: Secondary | ICD-10-CM | POA: Insufficient documentation

## 2021-04-29 DIAGNOSIS — M2550 Pain in unspecified joint: Secondary | ICD-10-CM | POA: Insufficient documentation

## 2021-04-29 DIAGNOSIS — O99013 Anemia complicating pregnancy, third trimester: Secondary | ICD-10-CM | POA: Diagnosis present

## 2021-04-29 DIAGNOSIS — Z3A33 33 weeks gestation of pregnancy: Secondary | ICD-10-CM | POA: Diagnosis not present

## 2021-04-29 DIAGNOSIS — O9A213 Injury, poisoning and certain other consequences of external causes complicating pregnancy, third trimester: Secondary | ICD-10-CM | POA: Diagnosis not present

## 2021-04-29 DIAGNOSIS — R079 Chest pain, unspecified: Secondary | ICD-10-CM | POA: Diagnosis not present

## 2021-04-29 DIAGNOSIS — T454X5A Adverse effect of iron and its compounds, initial encounter: Secondary | ICD-10-CM | POA: Insufficient documentation

## 2021-04-29 MED ORDER — SODIUM CHLORIDE 0.9 % IV SOLN
500.0000 mg | INTRAVENOUS | Status: DC
  Administered 2021-04-29: 500 mg via INTRAVENOUS
  Filled 2021-04-29: qty 25

## 2021-04-29 MED ORDER — LACTATED RINGERS IV SOLN: Freq: Once | INTRAVENOUS | Status: AC

## 2021-04-29 NOTE — Progress Notes (Signed)
25 yo g1 @ 33+3, pregnancy complicated by iugr, low-lying placenta, anemia, here at infusion center for iron infusion. About 2/3 of the way through patient developed chest tightness, joint pain, nausea. No wheeze, no diarrhea. No rash. 1 L of NS running. Iron infusion stopped. Complains of contractions but none palpated. Is on monitor, fetal hr 150s, moderate variability, no accels or decels. This appears to be a mild transfusion reaction though given vomiting will monitor closely. If worsening respiratory status would give epinephrine. Otherwise will monitor for one hour and discharge. Will be going straight to mfm for previously-scheduled bpp with cord dopplers.

## 2021-04-29 NOTE — Progress Notes (Signed)
NST completed.  Reactive for 33 3/7 weeks.  Has appt on 8/3 at Clinic.  Call clinic to see if needs to be seen prior to scheduled appt. Pt feels much better after fluid bolus.

## 2021-04-29 NOTE — Progress Notes (Signed)
G1P0 at 33 3/7 weeks is at Infusion Clinic receiving venefer IV.  About 2/3 finished, started complaining of lower abdominal sharp pain and having trouble breathing.  IV stopped. Applied monitors.  Abdomen palpates soft and nontender.  No leaking or bleeding. IV of LR bolusing.  Dr Ashok Pall updated on status.

## 2021-04-29 NOTE — Progress Notes (Signed)
VSS, pt feeling better after liter of normal saline and hour of monitoring.  PT DC home with home and told to seek emergency care if she has any worsening symptoms upon DC.  Both mom and patient verbalized understanding.

## 2021-04-29 NOTE — Progress Notes (Addendum)
Rapid response MD at bedside, pt developed nausea and swelling in bilateral hands.  One liter of LR hung at 999.  Rapid response OB nurse monitoring pt

## 2021-04-29 NOTE — Progress Notes (Signed)
PT C/O 7/10 pain in abdomen.  Denies chest pain,denies back pain.  denies nausea, denies dizziness.  States it feels like contractions.  VSS, laid pt on her left side and rapid response OB RN called and stated she would come see patient.

## 2021-04-29 NOTE — Progress Notes (Signed)
Dr Ashok Pall at bedside.  Assessment completed.  Will watch for another hour.  Continue IV bolus.  Called MFM regarding appointment she has today.  They will call patient and reschedule.

## 2021-05-06 ENCOUNTER — Ambulatory Visit: Payer: Medicaid Other | Attending: Maternal & Fetal Medicine

## 2021-05-06 ENCOUNTER — Ambulatory Visit: Payer: Medicaid Other | Admitting: *Deleted

## 2021-05-06 ENCOUNTER — Ambulatory Visit (HOSPITAL_BASED_OUTPATIENT_CLINIC_OR_DEPARTMENT_OTHER): Payer: Medicaid Other | Admitting: Maternal & Fetal Medicine

## 2021-05-06 ENCOUNTER — Encounter: Payer: Medicaid Other | Admitting: Family Medicine

## 2021-05-06 ENCOUNTER — Other Ambulatory Visit: Payer: Self-pay | Admitting: Maternal & Fetal Medicine

## 2021-05-06 ENCOUNTER — Encounter: Payer: Self-pay | Admitting: *Deleted

## 2021-05-06 ENCOUNTER — Other Ambulatory Visit: Payer: Self-pay

## 2021-05-06 ENCOUNTER — Other Ambulatory Visit: Payer: Self-pay | Admitting: *Deleted

## 2021-05-06 VITALS — BP 110/85 | HR 100

## 2021-05-06 DIAGNOSIS — O365930 Maternal care for other known or suspected poor fetal growth, third trimester, not applicable or unspecified: Secondary | ICD-10-CM

## 2021-05-06 DIAGNOSIS — Z3A34 34 weeks gestation of pregnancy: Secondary | ICD-10-CM | POA: Diagnosis not present

## 2021-05-06 DIAGNOSIS — Z363 Encounter for antenatal screening for malformations: Secondary | ICD-10-CM | POA: Diagnosis not present

## 2021-05-06 DIAGNOSIS — O4443 Low lying placenta NOS or without hemorrhage, third trimester: Secondary | ICD-10-CM

## 2021-05-06 DIAGNOSIS — Z364 Encounter for antenatal screening for fetal growth retardation: Secondary | ICD-10-CM

## 2021-05-06 DIAGNOSIS — O365931 Maternal care for other known or suspected poor fetal growth, third trimester, fetus 1: Secondary | ICD-10-CM | POA: Diagnosis present

## 2021-05-06 DIAGNOSIS — O4440 Low lying placenta NOS or without hemorrhage, unspecified trimester: Secondary | ICD-10-CM | POA: Diagnosis present

## 2021-05-06 DIAGNOSIS — O0933 Supervision of pregnancy with insufficient antenatal care, third trimester: Secondary | ICD-10-CM | POA: Diagnosis not present

## 2021-05-06 DIAGNOSIS — O365990 Maternal care for other known or suspected poor fetal growth, unspecified trimester, not applicable or unspecified: Secondary | ICD-10-CM

## 2021-05-06 NOTE — Progress Notes (Signed)
MFM Brief Note  Christine Bruce is a 25 yo G1P0 at 84 w 3 d here for follow up growth due to FGR and low lying placenta.  Normal interval growth with measurements consistent with FGR with EFW 4.5%. Biophysical profile was 8/8 with good fetal movement and amniotic fluid volume  The UA Dopplers were normal without evidence of AEDF or REDF.  The placenta remains low lying today at 1.06 cm from the internal os. Christine Bruce denies vaginal bleeding.   I discussed with Christine Bruce that determination of placental location will be performed in 3 weeks, at which time we can discuss option for mode of delivery. Data suggest that the placental location < 1 cm from the internal os have the high risk for bleeding. I explained that provider experience and comfort level will determine if a vaginal delivery vs cesarean delivery will be preferred.   Recommendation  Continue weekly testing with UA Dopplers Repeat growth in 3 weeks Reassess placental location at the next visit.  I spent 20 minutes with > 50% in face to face consultation.  Novella Olive, MD.

## 2021-05-07 ENCOUNTER — Encounter (HOSPITAL_COMMUNITY): Payer: Medicaid Other

## 2021-05-13 ENCOUNTER — Ambulatory Visit: Payer: Medicaid Other

## 2021-05-14 ENCOUNTER — Ambulatory Visit: Payer: Medicaid Other | Attending: Maternal & Fetal Medicine

## 2021-05-14 ENCOUNTER — Ambulatory Visit (INDEPENDENT_AMBULATORY_CARE_PROVIDER_SITE_OTHER): Payer: Medicaid Other | Admitting: Obstetrics & Gynecology

## 2021-05-14 ENCOUNTER — Ambulatory Visit: Payer: Medicaid Other | Admitting: *Deleted

## 2021-05-14 ENCOUNTER — Other Ambulatory Visit: Payer: Self-pay

## 2021-05-14 ENCOUNTER — Encounter: Payer: Self-pay | Admitting: *Deleted

## 2021-05-14 VITALS — BP 109/70 | HR 100

## 2021-05-14 VITALS — BP 116/85 | HR 92

## 2021-05-14 DIAGNOSIS — O4440 Low lying placenta NOS or without hemorrhage, unspecified trimester: Secondary | ICD-10-CM

## 2021-05-14 DIAGNOSIS — O365930 Maternal care for other known or suspected poor fetal growth, third trimester, not applicable or unspecified: Secondary | ICD-10-CM

## 2021-05-14 DIAGNOSIS — Z3A35 35 weeks gestation of pregnancy: Secondary | ICD-10-CM

## 2021-05-14 DIAGNOSIS — O0990 Supervision of high risk pregnancy, unspecified, unspecified trimester: Secondary | ICD-10-CM

## 2021-05-14 NOTE — Progress Notes (Addendum)
Subjective:  Christine Bruce is a 25 y.o. G1P0 at [redacted]w[redacted]d being seen today for prenatal care.  Patient reports  occasional contractions .  Contractions: Irritability.  Vag. Bleeding: None. Movement: Present. Denies leaking of fluid.   The following portions of the patient's history were reviewed and updated as appropriate: allergies, current medications, past family history, past medical history, past social history, past surgical history and problem list.   Objective:   Vitals:   05/14/21 1619  BP: 116/85  Pulse: 92    Fetal Status: Fetal Heart Rate (bpm): 154 Fundal Height: 34 cm Movement: Present     General:  Alert, oriented and cooperative. Patient is in no acute distress.  Skin: Skin is warm and dry. No rash noted.   Cardiovascular: Normal heart rate noted  Respiratory: Normal respiratory effort, no problems with respiration noted  Abdomen: Soft, gravid, appropriate for gestational age. Pain/Pressure: Present     Vaginal: Vag. Bleeding: None.       Cervix: Not evaluated        Extremities: Normal range of motion.  Edema: Trace  Mental Status: Normal mood and affect. Normal behavior. Normal judgment and thought content.    Assessment and Plan:  Pregnancy: G1P0 at [redacted]w[redacted]d  1. Supervision of high risk pregnancy, antepartum - Patient denies LOF or vaginal bleeding, she has experienced Baxton Hicks contractions and has felt good fetal movement  - She is wanting to deliver in [place] as she has moved and this is closer, discussed process for getting records transferred, will continue scheduled prenatal care with [place] at this time   2. [redacted] weeks gestation of pregnancy   3. Poor fetal growth affecting management of mother in third trimester, single or unspecified fetus - Weekly [place] with MFM  - Weekly BPP   4. Low-lying placenta - Will follow MFM recommendations for delivery    Preterm labor symptoms and general obstetric precautions including but not limited to vaginal bleeding,  contractions, leaking of fluid and fetal movement were reviewed in detail with the patient. Please refer to After Visit Summary for other counseling recommendations.  Return in about 1 week (around 05/21/2021).   Athena Masse, Student-PA Attestation of Attending Supervision of PA Student: Evaluation and management procedures were performed by the student under my supervision and collaboration.  I have reviewed the student's note and chart, and I agree with the management and plan.  Scheryl Darter, MD, FACOG Attending Obstetrician & Gynecologist Faculty Practice, [facility]

## 2021-05-15 ENCOUNTER — Other Ambulatory Visit: Payer: Self-pay | Admitting: *Deleted

## 2021-05-15 DIAGNOSIS — O4440 Low lying placenta NOS or without hemorrhage, unspecified trimester: Secondary | ICD-10-CM

## 2021-05-22 ENCOUNTER — Encounter: Payer: Self-pay | Admitting: *Deleted

## 2021-05-22 ENCOUNTER — Ambulatory Visit: Payer: Medicaid Other | Attending: Obstetrics and Gynecology

## 2021-05-22 ENCOUNTER — Other Ambulatory Visit: Payer: Self-pay

## 2021-05-22 ENCOUNTER — Ambulatory Visit: Payer: Medicaid Other | Admitting: *Deleted

## 2021-05-22 VITALS — BP 141/89 | HR 85

## 2021-05-22 DIAGNOSIS — Z363 Encounter for antenatal screening for malformations: Secondary | ICD-10-CM | POA: Insufficient documentation

## 2021-05-22 DIAGNOSIS — O365990 Maternal care for other known or suspected poor fetal growth, unspecified trimester, not applicable or unspecified: Secondary | ICD-10-CM

## 2021-05-22 DIAGNOSIS — O4440 Low lying placenta NOS or without hemorrhage, unspecified trimester: Secondary | ICD-10-CM | POA: Diagnosis present

## 2021-05-22 DIAGNOSIS — O365993 Maternal care for other known or suspected poor fetal growth, unspecified trimester, fetus 3: Secondary | ICD-10-CM | POA: Insufficient documentation

## 2021-05-22 DIAGNOSIS — O365930 Maternal care for other known or suspected poor fetal growth, third trimester, not applicable or unspecified: Secondary | ICD-10-CM | POA: Diagnosis not present

## 2021-05-22 DIAGNOSIS — O0933 Supervision of pregnancy with insufficient antenatal care, third trimester: Secondary | ICD-10-CM | POA: Diagnosis not present

## 2021-05-22 DIAGNOSIS — O4443 Low lying placenta NOS or without hemorrhage, third trimester: Secondary | ICD-10-CM | POA: Insufficient documentation

## 2021-05-22 DIAGNOSIS — Z3A36 36 weeks gestation of pregnancy: Secondary | ICD-10-CM | POA: Insufficient documentation

## 2021-05-27 ENCOUNTER — Ambulatory Visit: Payer: Medicaid Other | Admitting: *Deleted

## 2021-05-27 ENCOUNTER — Other Ambulatory Visit (HOSPITAL_COMMUNITY)
Admission: RE | Admit: 2021-05-27 | Discharge: 2021-05-27 | Disposition: A | Discharge status: Home / Self Care | Payer: Medicaid Other | Source: Ambulatory Visit | Attending: Family Medicine | Admitting: Family Medicine

## 2021-05-27 ENCOUNTER — Ambulatory Visit (INDEPENDENT_AMBULATORY_CARE_PROVIDER_SITE_OTHER): Payer: Medicaid Other | Admitting: Family Medicine

## 2021-05-27 ENCOUNTER — Other Ambulatory Visit: Payer: Self-pay

## 2021-05-27 ENCOUNTER — Ambulatory Visit: Payer: Medicaid Other | Attending: Obstetrics and Gynecology

## 2021-05-27 ENCOUNTER — Encounter: Payer: Self-pay | Admitting: *Deleted

## 2021-05-27 VITALS — BP 123/87 | HR 99 | Wt 113.0 lb

## 2021-05-27 VITALS — BP 105/77 | HR 103

## 2021-05-27 DIAGNOSIS — O365930 Maternal care for other known or suspected poor fetal growth, third trimester, not applicable or unspecified: Secondary | ICD-10-CM | POA: Diagnosis not present

## 2021-05-27 DIAGNOSIS — Z362 Encounter for other antenatal screening follow-up: Secondary | ICD-10-CM | POA: Diagnosis not present

## 2021-05-27 DIAGNOSIS — O0933 Supervision of pregnancy with insufficient antenatal care, third trimester: Secondary | ICD-10-CM

## 2021-05-27 DIAGNOSIS — O0990 Supervision of high risk pregnancy, unspecified, unspecified trimester: Secondary | ICD-10-CM

## 2021-05-27 DIAGNOSIS — O365990 Maternal care for other known or suspected poor fetal growth, unspecified trimester, not applicable or unspecified: Secondary | ICD-10-CM | POA: Diagnosis present

## 2021-05-27 DIAGNOSIS — O4440 Low lying placenta NOS or without hemorrhage, unspecified trimester: Secondary | ICD-10-CM

## 2021-05-27 DIAGNOSIS — Z3A37 37 weeks gestation of pregnancy: Secondary | ICD-10-CM

## 2021-05-27 DIAGNOSIS — O4443 Low lying placenta NOS or without hemorrhage, third trimester: Secondary | ICD-10-CM | POA: Diagnosis not present

## 2021-05-27 NOTE — Patient Instructions (Signed)

## 2021-05-27 NOTE — Progress Notes (Signed)
   PRENATAL VISIT NOTE  Subjective:  Christine Bruce is a 25 y.o. G1P0 at [redacted]w[redacted]d being seen today for ongoing prenatal care.  She is currently monitored for the following issues for this high-risk pregnancy and has Low-lying placenta; Supervision of high risk pregnancy, antepartum; Insufficient prenatal care in third trimester; Vulvovaginal candidiasis; Fundal height low for dates in second trimester; Anemia during pregnancy in third trimester; IUGR (intrauterine growth restriction) affecting care of mother; and Iron adverse reaction on their problem list.  Patient reports no complaints.  Contractions: Irritability. Vag. Bleeding: None.  Movement: Present. Denies leaking of fluid.   The following portions of the patient's history were reviewed and updated as appropriate: allergies, current medications, past family history, past medical history, past social history, past surgical history and problem list.   Objective:   Vitals:   05/27/21 0943  BP: 123/87  Pulse: 99  Weight: 113 lb (51.3 kg)    Fetal Status: Fetal Heart Rate (bpm): 154 Fundal Height: 32 cm Movement: Present  Presentation: Vertex  General:  Alert, oriented and cooperative. Patient is in no acute distress.  Skin: Skin is warm and dry. No rash noted.   Cardiovascular: Normal heart rate noted  Respiratory: Normal respiratory effort, no problems with respiration noted  Abdomen: Soft, gravid, appropriate for gestational age.  Pain/Pressure: Present     Pelvic: Cervical exam performed in the presence of a chaperone    patient did not tolerate exam and cervix was not reached.     Extremities: Normal range of motion.  Edema: Trace  Mental Status: Normal mood and affect. Normal behavior. Normal judgment and thought content.   Assessment and Plan:  Pregnancy: G1P0 at [redacted]w[redacted]d 1. Supervision of high risk pregnancy, antepartum Cultures today - GC/Chlamydia probe amp ([place])not at [facility] - Culture, beta strep (group b only)  2. Poor  fetal growth affecting management of mother in third trimester, single or unspecified fetus For fu growth scan today  3. Low-lying placenta resolved  Preterm labor symptoms and general obstetric precautions including but not limited to vaginal bleeding, contractions, leaking of fluid and fetal movement were reviewed in detail with the patient. Please refer to After Visit Summary for other counseling recommendations.   Return in 1 week (on 06/03/2021).  Future Appointments  Date Time Provider Department Center  06/04/2021  1:15 PM Constant, Gigi Gin, MD [facility]    Reva Bores, MD

## 2021-05-28 LAB — GC/CHLAMYDIA PROBE AMP (~~LOC~~) NOT AT ARMC
Chlamydia: NEGATIVE
Comment: NEGATIVE
Comment: NORMAL
Neisseria Gonorrhea: NEGATIVE

## 2021-05-30 LAB — CULTURE, BETA STREP (GROUP B ONLY): Strep Gp B Culture: NEGATIVE

## 2021-05-31 ENCOUNTER — Inpatient Hospital Stay (HOSPITAL_COMMUNITY): Payer: Medicaid Other

## 2021-05-31 ENCOUNTER — Inpatient Hospital Stay (HOSPITAL_COMMUNITY): Admission: AD | Admit: 2021-05-31 | Payer: Medicaid Other | Source: Home / Self Care | Admitting: Family Medicine

## 2021-05-31 ENCOUNTER — Encounter (HOSPITAL_COMMUNITY): Payer: Medicaid Other

## 2021-06-04 ENCOUNTER — Encounter: Payer: Medicaid Other | Admitting: Obstetrics and Gynecology

## 2021-06-14 ENCOUNTER — Inpatient Hospital Stay (HOSPITAL_COMMUNITY): Admit: 2021-06-14 | Payer: Self-pay

## 2021-06-25 ENCOUNTER — Encounter: Payer: Self-pay | Admitting: *Deleted

## 2022-07-14 IMAGING — US US MFM FETAL BPP W/ NON-STRESS
1 series · 13 of 28 positions shown · non-contrast
Comparison: none

[Series 1: us mfm fetal bpp w/ non-stress · 29 acquisitions, 13 frames shown]
[im 2/29]
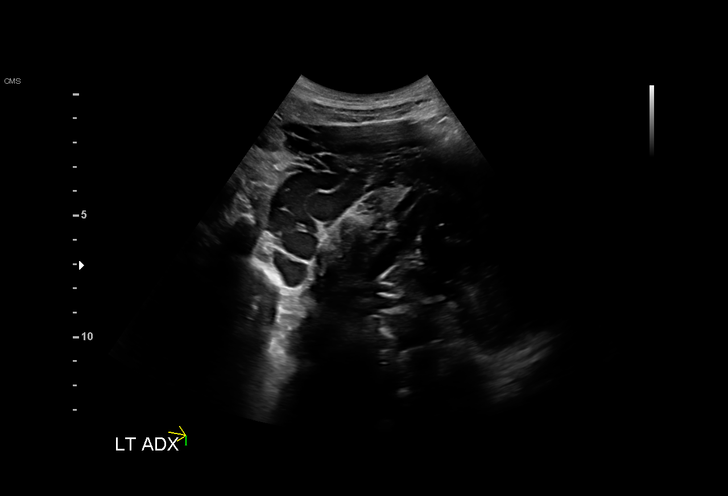
[im 4/29]
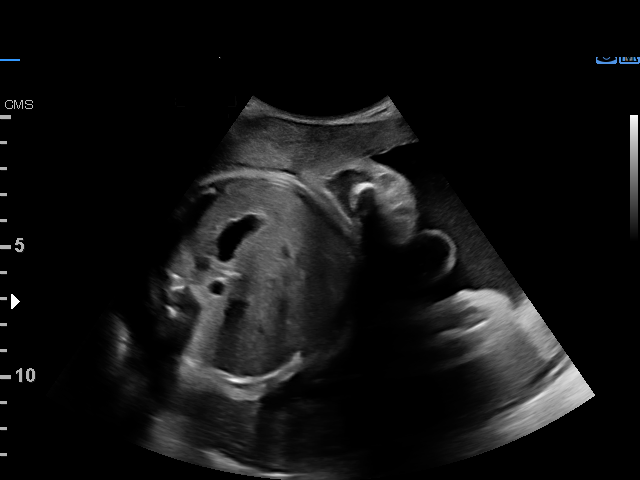
[im 6/29]
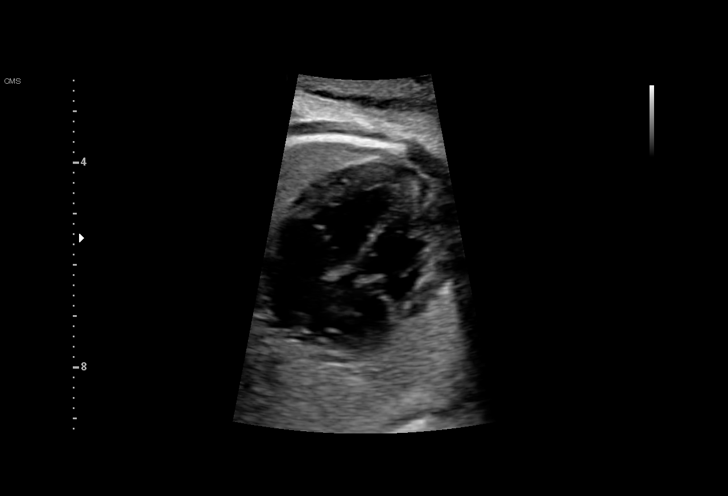
[im 8/29]
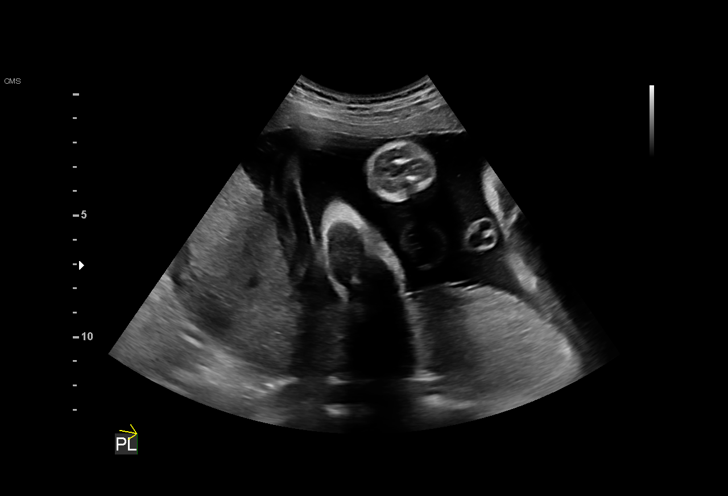
[im 10/29]
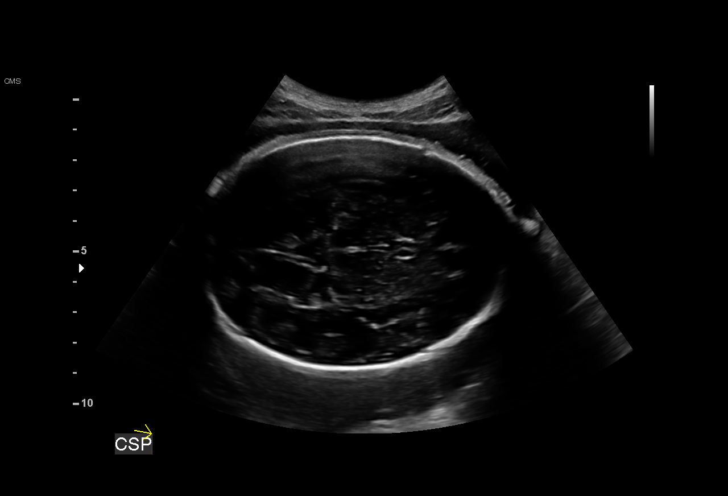
[im 12/29]
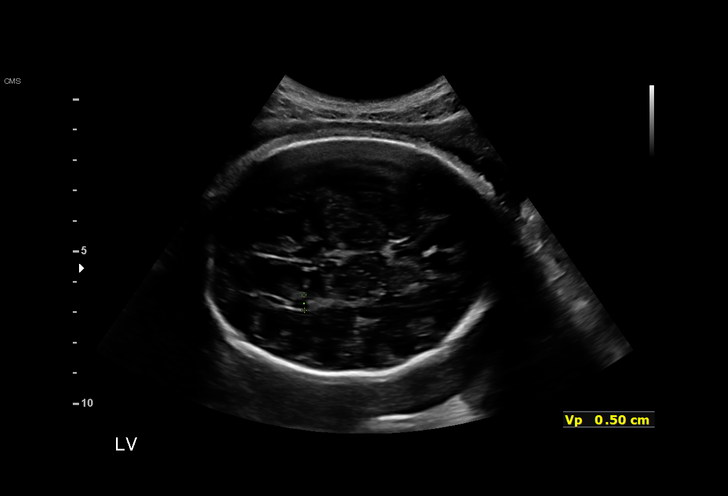
[im 15/29]
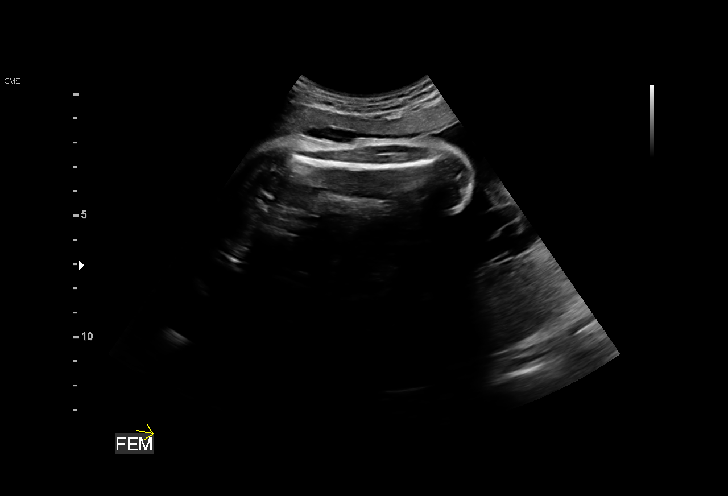
[im 17/29]
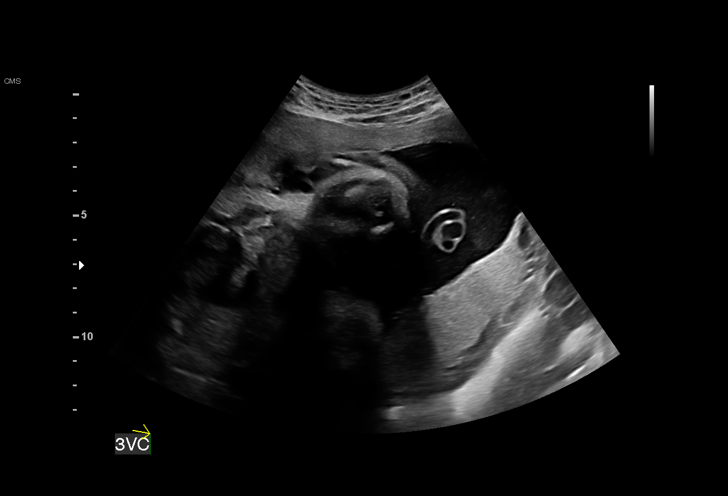
[im 19/29]
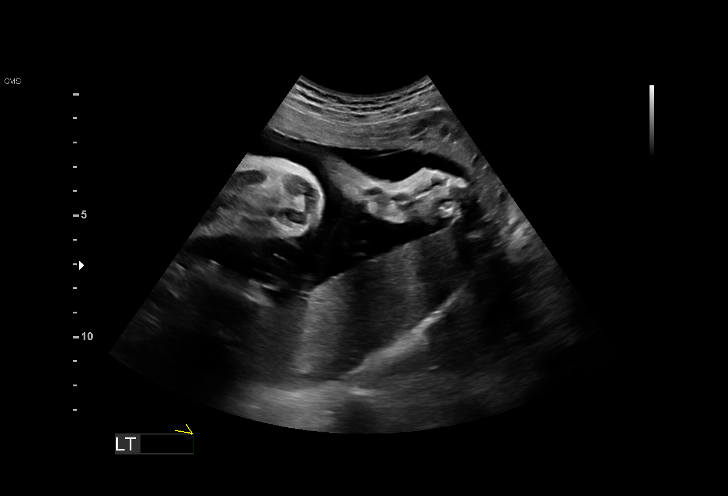
[im 21/29]
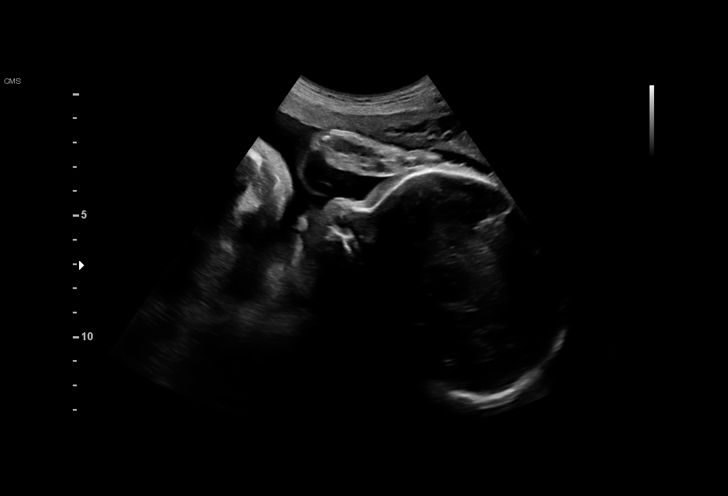
[im 23/29]
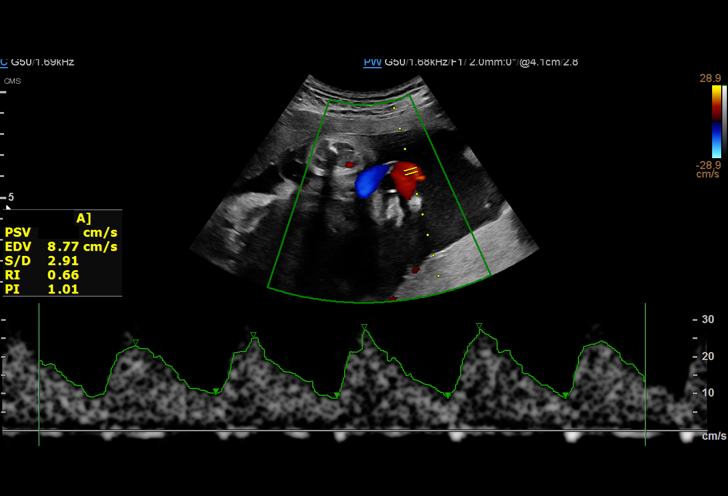
[im 25/29]
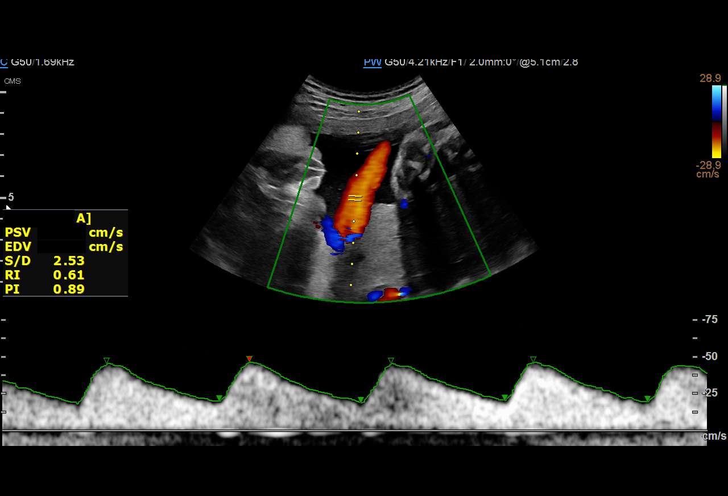
[im 27/29]
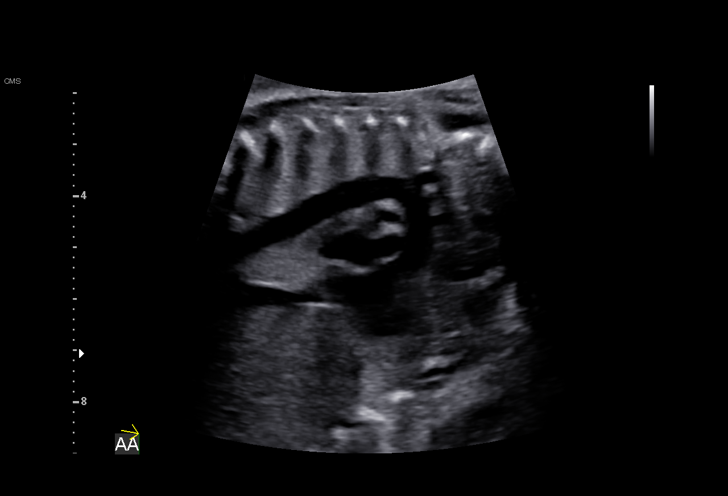

[13 of 28 positions shown; findings below may reference images not displayed]

1  US MFM UA CORD DOPPLER                76820.02    ASHNA
                                                      OG
    DONG-SEOB/NONSTRESS                                       OG

Indications

 Maternal care for known or suspected poor
 fetal growth, third trimester, not applicable or
 unspecified IUGR
 32 weeks gestation of pregnancy
 Antenatal screening for malformations
 Insufficient Prenatal Care
 Low lying placenta, antepartum
Fetal Evaluation

 Num Of Fetuses:         1
 Preg. Location:         Intrauterine
 Fetal Heart Rate(bpm):  167
 Cardiac Activity:       Observed
 Presentation:           Cephalic
 Placenta:               Posterior, low-lying

 Amniotic Fluid
 AFI FV:      Within normal limits

 AFI Sum(cm)     %Tile       Largest Pocket(cm)
 13.19           41

 RUQ(cm)       RLQ(cm)       LUQ(cm)        LLQ(cm)

Biophysical Evaluation

 Amniotic F.V:   Within normal limits       F. Tone:        Observed
 F. Movement:    Observed                   N.S.T:          Reactive
 F. Breathing:   Not Observed               Score:          [DATE]
Biometry

 LV:          5  mm
OB History

 Gravidity:    1
Gestational Age

 LMP:           32w 4d        Date:  09/07/20                 EDD:   06/14/21
 Best:          32w 4d     Det. By:  Early Ultrasound         EDD:   06/14/21
Anatomy

 Cranium:               Appears normal         LVOT:                   Previously seen
 Cavum:                 Appears normal         Aortic Arch:            Appears normal
 Ventricles:            Appears normal         Ductal Arch:            Previously seen
 Choroid Plexus:        Previously seen        Diaphragm:              Previously seen
 Cerebellum:            Previously seen        Stomach:                Appears normal, left
                                                                       sided
 Posterior Fossa:       Previously seen        Abdomen:                Appears normal
 Nuchal Fold:           Not applicable (>20    Abdominal Wall:         Limited Views
                        wks GA)
 Face:                  Profile appears        Cord Vessels:           Appears normal (3
                        normal                                         vessel cord)
 Lips:                  Previously seen        Kidneys:                Appear normal
 Palate:                Not well visualized    Bladder:                Appears normal
 Thoracic:              Previously seen        Spine:                  Previously seen
 Heart:                 Limited Views          Upper Extremities:      Visualized
 RVOT:                  Previously seen        Lower Extremities:      Visualized
Doppler - Fetal Vessels

 Umbilical Artery
  S/D     %tile      RI    %tile      PI    %tile            ADFV    RDFV
  2.83       62    0.65       69    0.98       68               No      No

Cervix Uterus Adnexa

 Cervix
 Not visualized (advanced GA >09wks)

 Uterus
 No abnormality visualized.

 Right Ovary
 Not visualized.
 Left Ovary
 Not visualized.

 Cul De Sac
 No free fluid seen.

 Adnexa
 No abnormality visualized.
Comments

 This patient was seen due to an IUGR fetus.  She denies any
 problems since her last exam.  She reports feeling vigorous
 fetal movements throughout the day.
 A biophysical profile performed today was  [DATE].  She
 received a -2 for fetal breathing movements that did not meet
 criteria.  She subsequently had a reactive nonstress test
 making her total biophysical profile score [DATE].
 There was normal amniotic fluid noted on today's ultrasound
 exam.
 Doppler studies of the umbilical arteries performed due to
 fetal growth restriction showed a normal S/D ratio of 2.83.
 There were no signs of absent or reversed end-diastolic flow
 noted today.
 Another biophysical profile and umbilical artery Doppler study
 was scheduled in 1 week.

 We will reassess the placental location during her future
 ultrasound exams and will recommend the mode of delivery
 based on the placental location later in her pregnancy.
